# Patient Record
Sex: Male | Born: 1965
Health system: Southern US, Community
[De-identification: ages and names within clinical notes are randomized; demographics above are authoritative.]

## PROBLEM LIST (undated history)

## (undated) DIAGNOSIS — F419 Anxiety disorder, unspecified: Secondary | ICD-10-CM

## (undated) HISTORY — PX: CSF SHUNT: SHX92

## (undated) HISTORY — DX: Anxiety disorder, unspecified: F41.9

---

## 2011-05-24 ENCOUNTER — Ambulatory Visit: Payer: Self-pay | Admitting: Internal Medicine

## 2011-10-11 ENCOUNTER — Emergency Department: Payer: Self-pay | Admitting: Internal Medicine

## 2012-11-28 ENCOUNTER — Emergency Department: Payer: Self-pay | Admitting: Internal Medicine

## 2013-09-15 ENCOUNTER — Emergency Department: Payer: Self-pay | Admitting: Emergency Medicine

## 2013-09-15 LAB — BASIC METABOLIC PANEL
Anion Gap: 3 — ABNORMAL LOW (ref 7–16)
BUN: 10 mg/dL (ref 7–18)
Calcium, Total: 9.1 mg/dL (ref 8.5–10.1)
Creatinine: 1.02 mg/dL (ref 0.60–1.30)
EGFR (African American): >60
EGFR (Non-African Amer.): >60
Osmolality: 274 (ref 275–301)
Sodium: 137 mmol/L (ref 136–145)

## 2013-09-15 LAB — CBC
HCT: 40.9 % (ref 40.0–52.0)
MCHC: 35.2 g/dL (ref 32.0–36.0)
MCV: 85 fL (ref 80–100)
RDW: 13 % (ref 11.5–14.5)
WBC: 10.6 10*3/uL (ref 3.8–10.6)

## 2014-09-19 IMAGING — CR DG CHEST 2V
1 series · 3 of 3 positions shown · non-contrast
Comparison: None.

CLINICAL DATA: Chest pain today with tingling in left arm and jaw
pain

EXAM:
CHEST  2 VIEW

[Series 1: pa · 0.17mm/px · 3 of 3 slices shown]
[im 1/3]
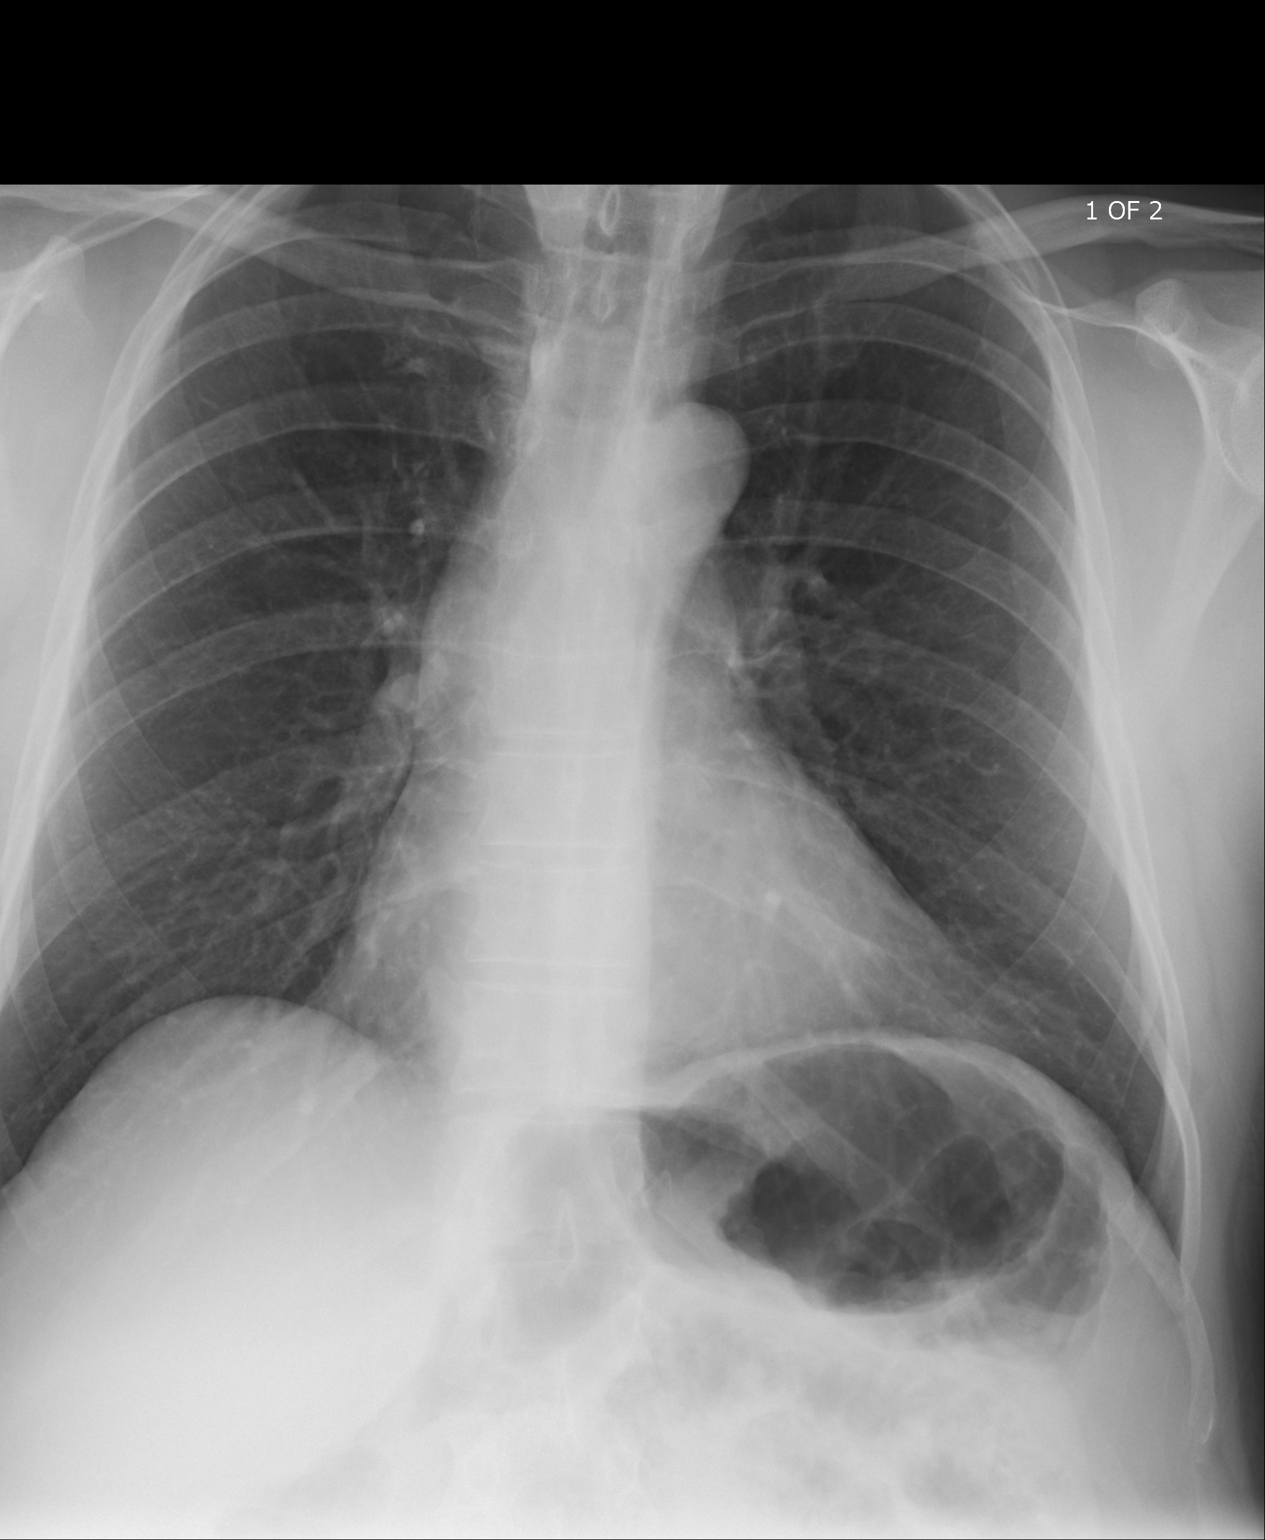
[im 2/3]
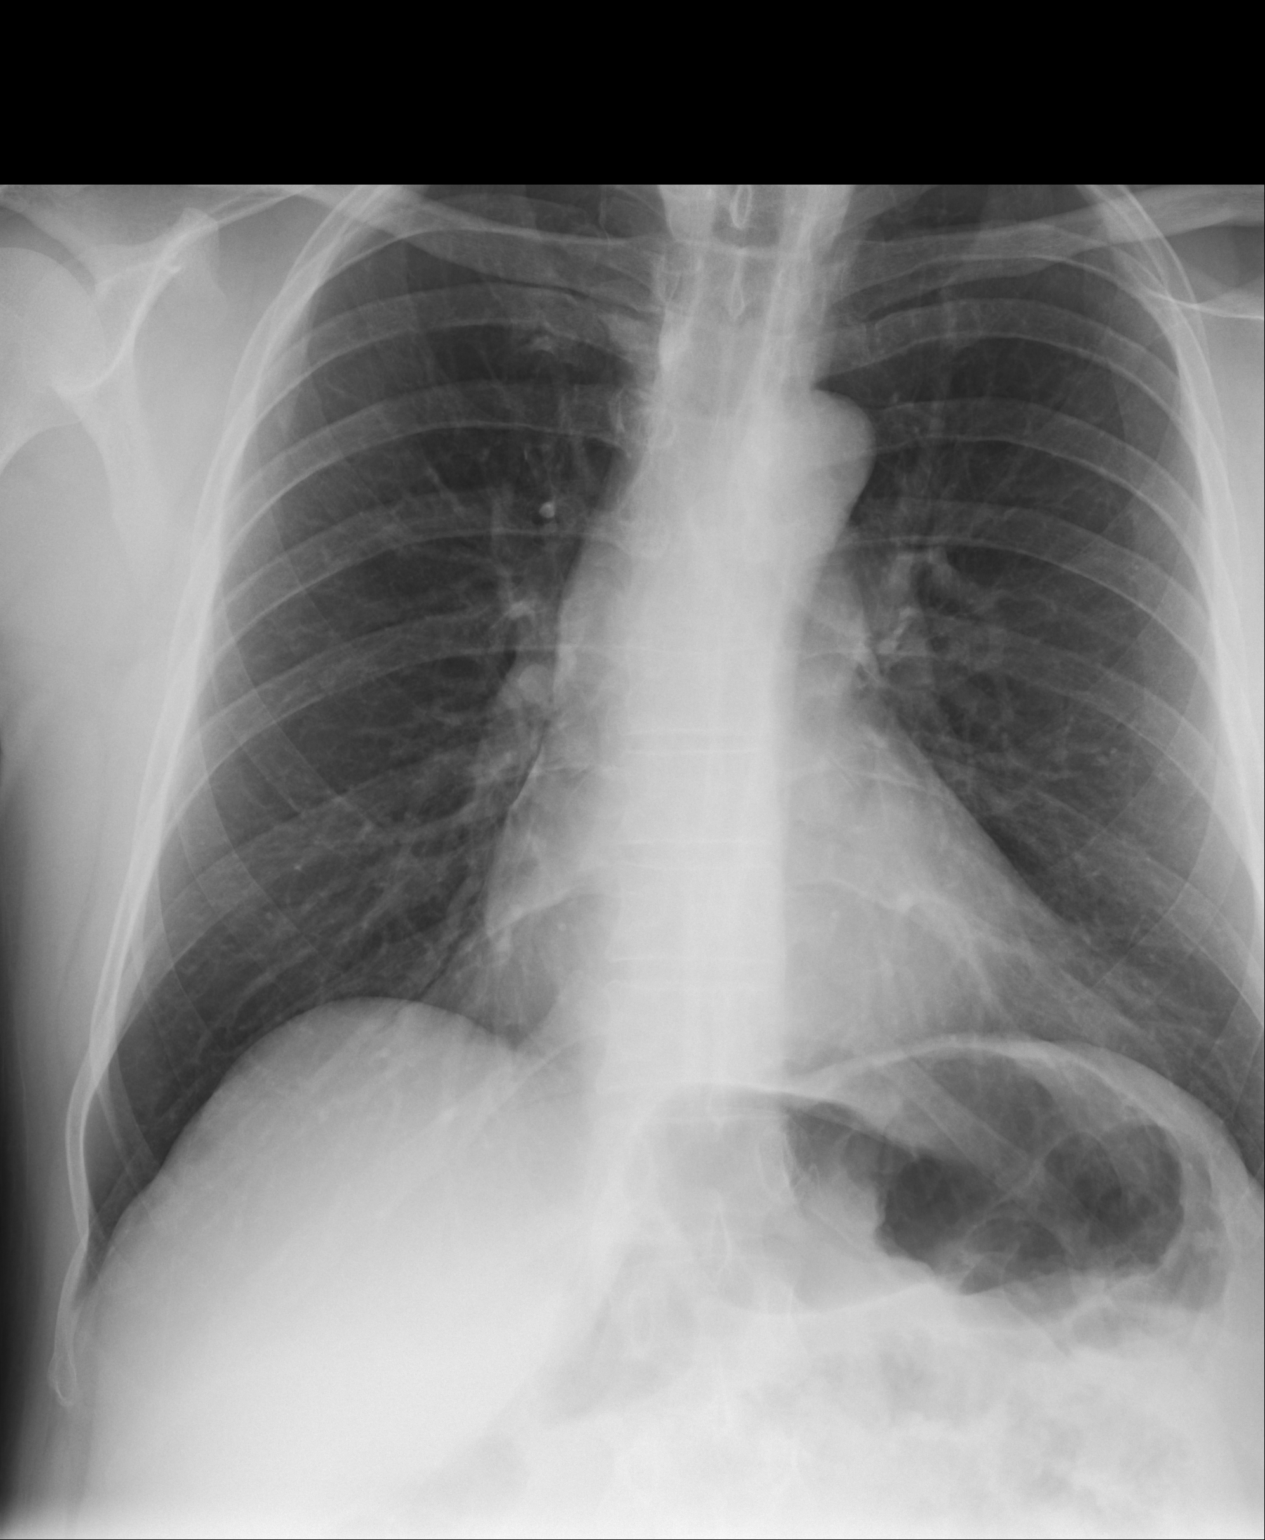
[im 3/3]
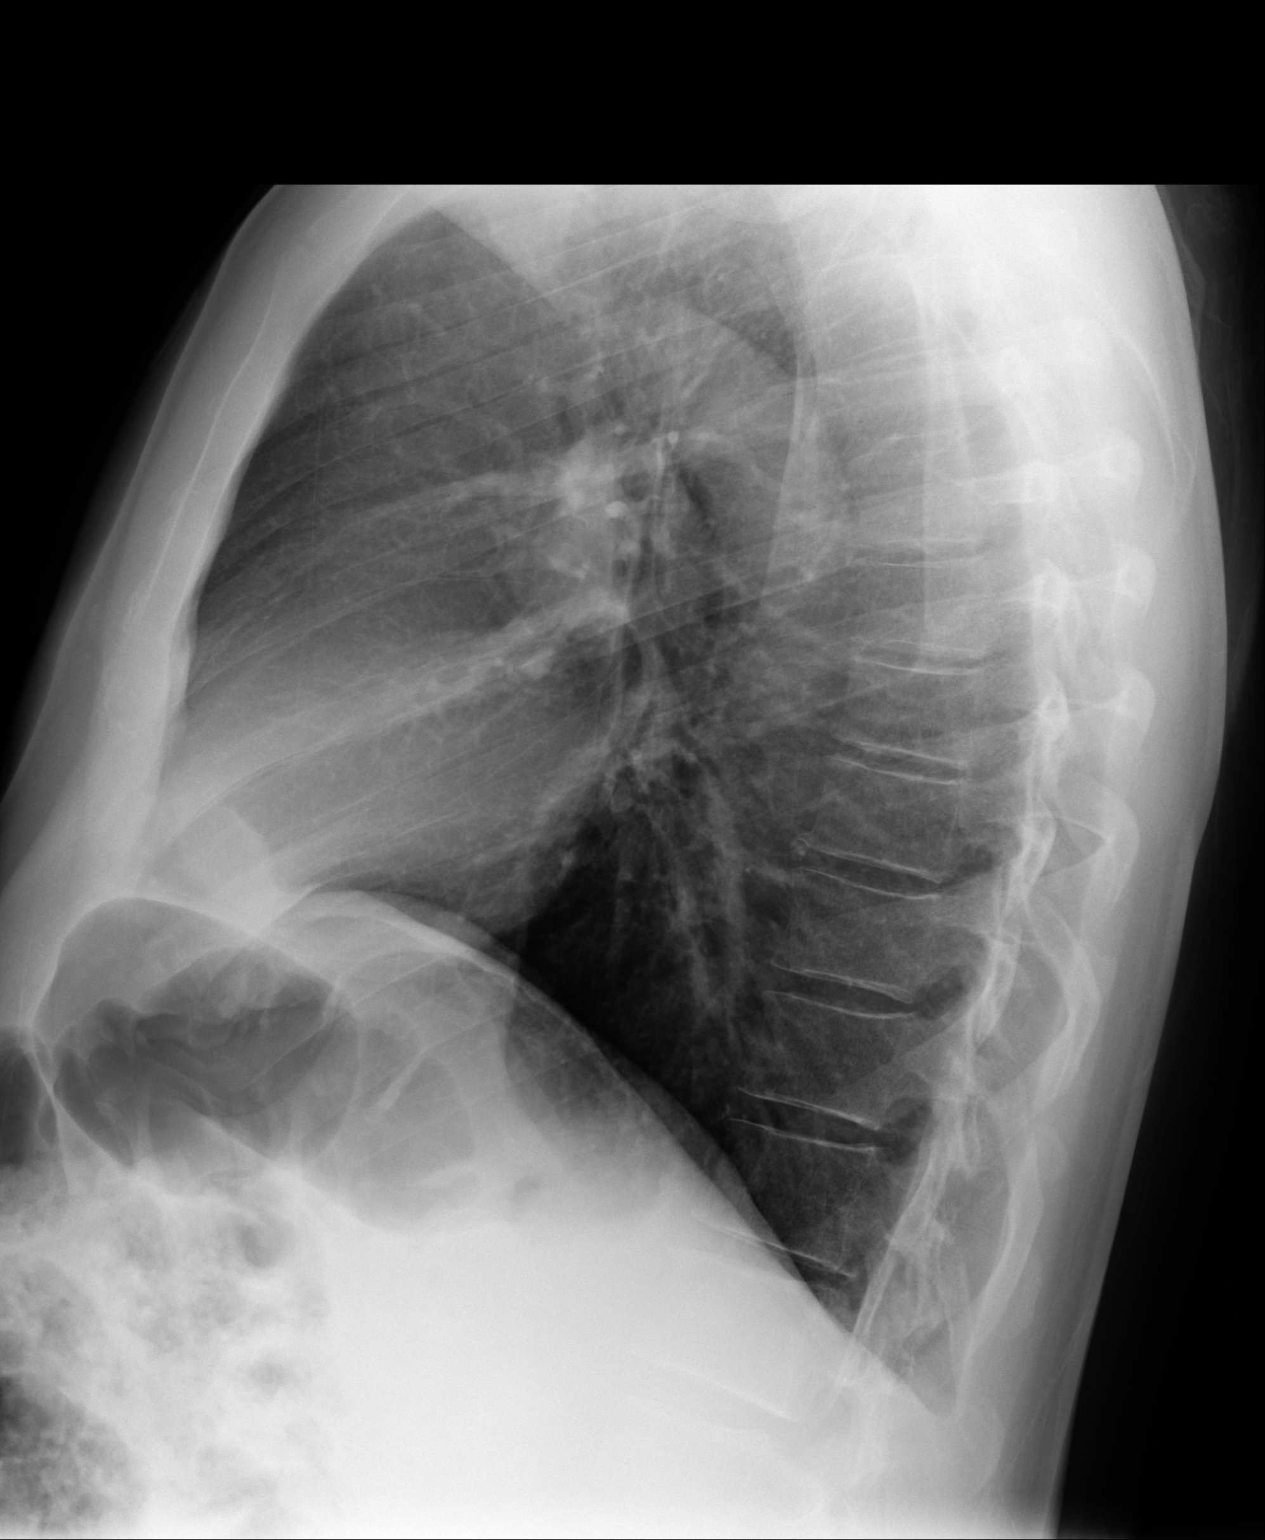

[3 of 3 positions shown; findings below may reference images not displayed]

FINDINGS: The heart size and mediastinal contours are within normal limits.
Both lungs are clear. The visualized skeletal structures are
unremarkable.
IMPRESSION: No active cardiopulmonary disease.

## 2020-07-03 ENCOUNTER — Ambulatory Visit (INDEPENDENT_AMBULATORY_CARE_PROVIDER_SITE_OTHER): Payer: BC Managed Care – PPO | Admitting: Family Medicine

## 2020-07-03 ENCOUNTER — Encounter: Payer: Self-pay | Admitting: Family Medicine

## 2020-07-03 ENCOUNTER — Other Ambulatory Visit: Payer: Self-pay

## 2020-07-03 VITALS — BP 142/90 | HR 80 | Temp 98.6°F | Ht 74.5 in | Wt 216.0 lb

## 2020-07-03 DIAGNOSIS — Z1211 Encounter for screening for malignant neoplasm of colon: Secondary | ICD-10-CM

## 2020-07-03 DIAGNOSIS — Z131 Encounter for screening for diabetes mellitus: Secondary | ICD-10-CM | POA: Diagnosis not present

## 2020-07-03 DIAGNOSIS — Z114 Encounter for screening for human immunodeficiency virus [HIV]: Secondary | ICD-10-CM

## 2020-07-03 DIAGNOSIS — Z13220 Encounter for screening for lipoid disorders: Secondary | ICD-10-CM

## 2020-07-03 DIAGNOSIS — I10 Essential (primary) hypertension: Secondary | ICD-10-CM

## 2020-07-03 DIAGNOSIS — G4700 Insomnia, unspecified: Secondary | ICD-10-CM | POA: Diagnosis not present

## 2020-07-03 DIAGNOSIS — F411 Generalized anxiety disorder: Secondary | ICD-10-CM

## 2020-07-03 DIAGNOSIS — E663 Overweight: Secondary | ICD-10-CM

## 2020-07-03 DIAGNOSIS — F320 Major depressive disorder, single episode, mild: Secondary | ICD-10-CM

## 2020-07-03 DIAGNOSIS — Z1159 Encounter for screening for other viral diseases: Secondary | ICD-10-CM | POA: Diagnosis not present

## 2020-07-03 DIAGNOSIS — F329 Major depressive disorder, single episode, unspecified: Secondary | ICD-10-CM | POA: Insufficient documentation

## 2020-07-03 MED ORDER — TRAZODONE HCL 50 MG PO TABS: 25.0000 mg | ORAL_TABLET | Freq: Every evening | ORAL | 3 refills | Status: DC | PRN

## 2020-07-03 NOTE — Assessment & Plan Note (Signed)
Suspect 2/2 to anxiety. He would like to try trazodone. Also treatment for anxiety.

## 2020-07-03 NOTE — Progress Notes (Signed)
Subjective:     Brian Melendez is a 54 y.o. male presenting for Establish Care     HPI  #Depression/anxiety - some mild symptoms  - mom is having some health issues, stress work - taking benadryl at night to help with sleep - will stay up thinking about all the stress and work related thing  #Elevated blood pressures - has checked at [facility] with high bp - and at the ER  Review of Systems   Social History   Tobacco Use  Smoking Status Never Smoker  Smokeless Tobacco Never Used        Objective:    BP Readings from Last 3 Encounters:  07/03/20 (!) 142/90   Wt Readings from Last 3 Encounters:  07/03/20 216 lb (98 kg)    BP (!) 142/90   Pulse 80   Temp 98.6 F (37 C) (Temporal)   Ht 6' 2.5" (1.892 m)   Wt 216 lb (98 kg)   SpO2 97%   BMI 27.36 kg/m    Physical Exam Constitutional:      Appearance: Normal appearance. He is not ill-appearing or diaphoretic.  HENT:     Right Ear: External ear normal.     Left Ear: External ear normal.     Nose: Nose normal.  Eyes:     General: No scleral icterus.    Extraocular Movements: Extraocular movements intact.     Conjunctiva/sclera: Conjunctivae normal.  Cardiovascular:     Rate and Rhythm: Normal rate and regular rhythm.     Heart sounds: No murmur heard.   Pulmonary:     Effort: Pulmonary effort is normal. No respiratory distress.     Breath sounds: Normal breath sounds. No wheezing.  Musculoskeletal:     Cervical back: Neck supple.  Skin:    General: Skin is warm and dry.  Neurological:     Mental Status: He is alert. Mental status is at baseline.  Psychiatric:        Mood and Affect: Mood normal.      Depression screen [facility] 2/9 07/03/2020  Decreased Interest 2  Down, Depressed, Hopeless 1  PHQ - 2 Score 3  Altered sleeping 2  Tired, decreased energy 2  Change in appetite 1  Feeling bad or failure about yourself  1  Trouble concentrating 0  Moving slowly or fidgety/restless 0  Suicidal  thoughts 0  PHQ-9 Score 9  Difficult doing work/chores Somewhat difficult   GAD 7 : Generalized Anxiety Score 07/03/2020  Nervous, Anxious, on Edge 3  Control/stop worrying 1  Worry too much - different things 2  Trouble relaxing 3  Restless 0  Easily annoyed or irritable 2  Afraid - awful might happen 1  Total GAD 7 Score 12  Anxiety Difficulty Somewhat difficult           Assessment & Plan:   Problem List Items Addressed This Visit      Cardiovascular and Mediastinum   Essential hypertension    BP elevated today and previously. Labs. Offered medication today but he will do lifestyle changes and home monitoring. Return 3 months      Relevant Orders   Comprehensive metabolic panel   CBC   TSH     Other   Overweight (BMI 25.0-29.9)   Insomnia - Primary    Suspect 2/2 to anxiety. He would like to try trazodone. Also treatment for anxiety.       Relevant Medications   traZODone (DESYREL) 50 MG  tablet   Generalized anxiety disorder    Hx consistent with anxiety and gad and phq-9 elevated. Pt not interested in medication at this time. Advised therapy and hand out given for mindfulness practice. Return if worsening or wanting to try medication      Relevant Medications   traZODone (DESYREL) 50 MG tablet   Major depressive disorder with single episode   Relevant Medications   traZODone (DESYREL) 50 MG tablet    Other Visit Diagnoses    Screening for hyperlipidemia       Relevant Orders   Lipid panel   Screening for diabetes mellitus       Relevant Orders   Hemoglobin A1c   Need for hepatitis C screening test       Relevant Orders   Hepatitis C antibody   Encounter for screening for HIV       Relevant Orders   HIV Antibody (routine testing w rflx)   Encounter for screening colonoscopy       Relevant Orders   Fecal occult blood, imunochemical       Return in about 3 months (around 10/03/2020).  Lynnda Child, MD  This visit occurred during the  SARS-CoV-2 public health emergency.  Safety protocols were in place, including screening questions prior to the visit, additional usage of staff PPE, and extensive cleaning of exam room while observing appropriate contact time as indicated for disinfecting solutions.

## 2020-07-03 NOTE — Patient Instructions (Addendum)
Your blood pressure high.   High blood pressure increases your risk for heart attack and stroke.    Please check your blood pressure 2-4 times a week.   To check your blood pressure 1) Sit in a quiet and relaxed place for 5 minutes 2) Make sure your feet are flat on the ground 3) Consider checking first thing in the morning   Normal blood pressure is less than 140/90 Ideally you blood pressure should be around 120/80  Other ways you can reduce your blood pressure:  1) Regular exercise -- Try to get 150 minutes (30 minutes, 5 days a week) of moderate to vigorous aerobic excercise -- Examples: brisk walking (2.5 miles per hour), water aerobics, dancing, gardening, tennis, biking slower than 10 miles per hour 2) DASH Diet - low fat meats, more fresh fruits and vegetables, whole grains, low salt 3) Quit smoking if you smoke 4) Loose 5-10% of your body weight     How to help anxiety - without medication.   1) Regular Exercise - walking, jogging, cycling, dancing, strength training --> Yoga has been shown in research to reduce depression and anxiety -- with even just one hour long session per week  2)  Begin a Mindfulness/Meditation practice -- this can take a little as 3 minutes and is helpful for all kinds of mood issues -- You can find resources in books -- Or you can download apps like  ---- Headspace App (which currently has free content called "Weathering the Storm") ---- Calm (which has a few free options)  ---- Insignt Timer ---- Stop, Breathe & Think  # With each of these Apps - you should decline the "start free trial" offer and as you search through the App should be able to access some of their free content. You can also chose to pay for the content if you find one that works well for you.   # Many of them also offer sleep specific content which may help with insomnia  3) Healthy Diet -- Avoid or decrease Caffeine -- Avoid or decrease Alcohol -- Drink plenty of  water, have a balanced diet -- Avoid cigarettes and marijuana (as well as other recreational drugs)  4) Consider contacting a professional therapist  -- [facility] is one option. Call [phone] -- Or you can check out www.psychologytoday.com -- you can read bios of therapists and see if they accept insurance -- Check with your insurance to see if you have coverage and who may take your insurance     Sleep hygiene checklist: 1. Avoid naps during the day 2. Avoid stimulants such as caffeine and nicotine. Avoid bedtime alcohol (it can speed onset of sleep but the body's metabolism can cause awakenings). At least 2 hours before bedtime 3. All forms of exercise help ensure sound sleep - limit vigorous exercise to morning or late afternoon 4. Avoid food too close to bedtime including chocolate (which contains caffeine) 5. Soak up natural light 6. Establish regular bedtime routine. 7. Associate bed with sleep - avoid TV, computer or phone, reading while in bed. 8. Ensure pleasant, relaxing sleep environment - quiet, dark, cool room.  Good Sleep Hygiene Habits -- Got to bed and wake up within an hour of the same time every day -- Avoid bright screens (from laptop, phone, TV) within at least 30 minutes before bed. The "blue light" supresses the sleep hormone melatonin and the content may stimulate as well -- Maintain a quiet and dark sleep environment (  blackout curtains, turn on a fan or white noise to block out disruptive sounds) -- Practicing relaxing activites before bed (taking a shower, reading a book, journaling, meditation app) -- To quiet a busy mind -- consider journaling before bed (jotting down reminders, worry thoughts, as well as positive things like a gratitude list)

## 2020-07-03 NOTE — Assessment & Plan Note (Signed)
BP elevated today and previously. Labs. Offered medication today but he will do lifestyle changes and home monitoring. Return 3 months

## 2020-07-03 NOTE — Assessment & Plan Note (Signed)
Hx consistent with anxiety and gad and phq-9 elevated. Pt not interested in medication at this time. Advised therapy and hand out given for mindfulness practice. Return if worsening or wanting to try medication

## 2020-07-04 LAB — CBC
HCT: 42 % (ref 39.0–52.0)
Hemoglobin: 13.9 g/dL (ref 13.0–17.0)
MCHC: 33.2 g/dL (ref 30.0–36.0)
MCV: 85.7 fl (ref 78.0–100.0)
Platelets: 256 10*3/uL (ref 150.0–400.0)
RBC: 4.9 Mil/uL (ref 4.22–5.81)
RDW: 13.4 % (ref 11.5–15.5)
WBC: 11.9 10*3/uL — ABNORMAL HIGH (ref 4.0–10.5)

## 2020-07-04 LAB — LIPID PANEL
Cholesterol: 218 mg/dL — ABNORMAL HIGH (ref 0–200)
HDL: 40.3 mg/dL (ref >39.00)
LDL Cholesterol: 145 mg/dL — ABNORMAL HIGH (ref 0–99)
NonHDL: 177.47
Total CHOL/HDL Ratio: 5
Triglycerides: 163 mg/dL — ABNORMAL HIGH (ref 0.0–149.0)
VLDL: 32.6 mg/dL (ref 0.0–40.0)

## 2020-07-04 LAB — COMPREHENSIVE METABOLIC PANEL
ALT: 22 U/L (ref 0–53)
AST: 21 U/L (ref 0–37)
Albumin: 4.5 g/dL (ref 3.5–5.2)
Alkaline Phosphatase: 75 U/L (ref 39–117)
BUN: 14 mg/dL (ref 6–23)
CO2: 31 mEq/L (ref 19–32)
Calcium: 9.9 mg/dL (ref 8.4–10.5)
Chloride: 101 mEq/L (ref 96–112)
Creatinine, Ser: 1 mg/dL (ref 0.40–1.50)
GFR: 78.32 mL/min (ref >60.00)
Glucose, Bld: 88 mg/dL (ref 70–99)
Potassium: 4.4 mEq/L (ref 3.5–5.1)
Sodium: 138 mEq/L (ref 135–145)
Total Bilirubin: 1 mg/dL (ref 0.2–1.2)
Total Protein: 7.3 g/dL (ref 6.0–8.3)

## 2020-07-04 LAB — HEMOGLOBIN A1C: Hgb A1c MFr Bld: 6.1 % (ref 4.6–6.5)

## 2020-07-04 LAB — HEPATITIS C ANTIBODY
Hepatitis C Ab: NONREACTIVE
SIGNAL TO CUT-OFF: 0.22 (ref <1.00)

## 2020-07-04 LAB — TSH: TSH: 2.07 u[IU]/mL (ref 0.35–4.50)

## 2020-07-04 LAB — HIV ANTIBODY (ROUTINE TESTING W REFLEX): HIV 1&2 Ab, 4th Generation: NONREACTIVE

## 2020-08-18 ENCOUNTER — Encounter: Payer: Self-pay | Admitting: Family Medicine

## 2020-10-03 ENCOUNTER — Other Ambulatory Visit: Payer: Self-pay

## 2020-10-03 ENCOUNTER — Ambulatory Visit (INDEPENDENT_AMBULATORY_CARE_PROVIDER_SITE_OTHER): Payer: BC Managed Care – PPO | Admitting: Family Medicine

## 2020-10-03 VITALS — BP 126/82 | HR 88 | Temp 98.3°F | Ht 75.0 in | Wt 215.5 lb

## 2020-10-03 DIAGNOSIS — G4700 Insomnia, unspecified: Secondary | ICD-10-CM | POA: Diagnosis not present

## 2020-10-03 DIAGNOSIS — F320 Major depressive disorder, single episode, mild: Secondary | ICD-10-CM

## 2020-10-03 DIAGNOSIS — Z1211 Encounter for screening for malignant neoplasm of colon: Secondary | ICD-10-CM | POA: Diagnosis not present

## 2020-10-03 DIAGNOSIS — F411 Generalized anxiety disorder: Secondary | ICD-10-CM | POA: Diagnosis not present

## 2020-10-03 MED ORDER — TRAZODONE HCL 50 MG PO TABS: 75.0000 mg | ORAL_TABLET | Freq: Every evening | ORAL | 3 refills | Status: DC | PRN

## 2020-10-03 MED ORDER — ESCITALOPRAM OXALATE 10 MG PO TABS: 10.0000 mg | ORAL_TABLET | Freq: Every day | ORAL | 1 refills | Status: DC

## 2020-10-03 NOTE — Patient Instructions (Addendum)
You are going to start a new antidepressant medication.   One of the risks of this medication is increase in suicidal thoughts.   Your suicide Action plan is as follows:  1) Call sister, girlfriend, son 2) Call the Suicide Hotline [phone] which is available 24 hours 3) Call the Clinic   The most common side effect is stomach upset. If this happens it means the medication is working. It should get better in 1-3 weeks.   Medication for depression and anxiety often takes 6-8 weeks to have a noticeable difference so stick with it. Also the best way for recovery is taking medication and seeing a therapist -- this is so important.    Send MyChart message as needed to update

## 2020-10-03 NOTE — Assessment & Plan Note (Signed)
Some initial improvement with trazodone but wearing off. Increase dose to 75-100 mg.

## 2020-10-03 NOTE — Progress Notes (Signed)
Subjective:     Brian Melendez is a 54 y.o. 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 Insomnia - trazodone. Anxiety - therapy and handout.  08/18/2020: Mychart - anxiety worse - ashwagandha (mother end of life)  Social History   Tobacco Use  Smoking Status Never Smoker  Smokeless Tobacco Never Used        Objective:    BP Readings from Last 3 Encounters:  10/03/20 126/82  07/03/20 (!) 142/90   Wt Readings from Last 3 Encounters:  10/03/20 215 lb 8 oz (97.8 kg)  07/03/20 216 lb (98 kg)    BP 126/82   Pulse 88   Temp 98.3 F (36.8 C) (Temporal)   Ht 6\' 3"  (1.905 m)   Wt 215 lb 8 oz (97.8 kg)   SpO2 97%   BMI 26.94 kg/m    Physical Exam Constitutional:      Appearance: Normal appearance. He is not ill-appearing or diaphoretic.  HENT:     Right Ear: External ear normal.     Left Ear: External ear normal.  Eyes:     General: No scleral icterus.    Extraocular Movements: Extraocular movements intact.     Conjunctiva/sclera: Conjunctivae normal.  Cardiovascular:     Rate and Rhythm: Normal rate.  Pulmonary:     Effort: Pulmonary effort is normal.  Musculoskeletal:     Cervical back: Neck supple.  Skin:    General: Skin is warm and dry.  Neurological:     Mental Status: He is alert. Mental status is at baseline.  Psychiatric:        Mood and Affect: Mood is depressed.      Depression screen [facility] 2/9 10/03/2020 07/03/2020  Decreased Interest 3 2  Down,  Depressed, Hopeless 3 1  PHQ - 2 Score 6 3  Altered sleeping 3 2  Tired, decreased energy 3 2  Change in appetite 2 1  Feeling bad or failure about yourself  3 1  Trouble concentrating 2 0  Moving slowly or fidgety/restless 1 0  Suicidal thoughts 0 0  PHQ-9 Score 20 9  Difficult doing work/chores Somewhat difficult Somewhat difficult   GAD 7 : Generalized Anxiety Score 10/03/2020 07/03/2020  Nervous, Anxious, on Edge 3 3  Control/stop worrying 3 1  Worry too much - different things 3 2  Trouble relaxing 3 3  Restless 1 0  Easily annoyed or irritable 3 2  Afraid - awful might happen 1 1  Total GAD 7 Score 17 12  Anxiety Difficulty Somewhat difficult Somewhat difficult           Assessment & Plan:   Problem List Items Addressed This Visit      Other   Insomnia    Some initial improvement with trazodone but wearing off. Increase dose to 75-100 mg.       Relevant Medications   traZODone (DESYREL) 50 MG tablet  Generalized anxiety disorder - Primary    Symptoms worse. Start Lexapro 10 mg. Return in 6 weeks      Relevant Medications   escitalopram (LEXAPRO) 10 MG tablet   traZODone (DESYREL) 50 MG tablet   Major depressive disorder with single episode    Symptoms worse in setting of his mother's death 1 month ago. Will start lexapro 10 mg daily. He will look into therapy options as well.       Relevant Medications   escitalopram (LEXAPRO) 10 MG tablet   traZODone (DESYREL) 50 MG tablet    Other Visit Diagnoses    Screening for colon cancer       Relevant Orders   Fecal occult blood, imunochemical       Return in about 6 weeks (around 11/14/2020).  Lynnda Child, MD  This visit occurred during the SARS-CoV-2 public health emergency.  Safety protocols were in place, including screening questions prior to the visit, additional usage of staff PPE, and extensive cleaning of exam room while observing appropriate contact time as indicated for disinfecting  solutions.

## 2020-10-03 NOTE — Assessment & Plan Note (Signed)
Symptoms worse in setting of his mother's death 1 month ago. Will start lexapro 10 mg daily. He will look into therapy options as well.

## 2020-10-03 NOTE — Assessment & Plan Note (Signed)
Symptoms worse. Start Lexapro 10 mg. Return in 6 weeks

## 2020-11-03 ENCOUNTER — Other Ambulatory Visit: Payer: Self-pay | Admitting: Family Medicine

## 2020-11-03 DIAGNOSIS — F411 Generalized anxiety disorder: Secondary | ICD-10-CM

## 2020-11-03 DIAGNOSIS — F320 Major depressive disorder, single episode, mild: Secondary | ICD-10-CM

## 2020-11-13 ENCOUNTER — Encounter: Payer: Self-pay | Admitting: Family Medicine

## 2020-11-13 DIAGNOSIS — F411 Generalized anxiety disorder: Secondary | ICD-10-CM

## 2020-11-13 DIAGNOSIS — F320 Major depressive disorder, single episode, mild: Secondary | ICD-10-CM

## 2020-11-13 MED ORDER — ESCITALOPRAM OXALATE 10 MG PO TABS: 10.0000 mg | ORAL_TABLET | Freq: Every day | ORAL | 1 refills | Status: DC

## 2020-11-14 NOTE — Telephone Encounter (Signed)
Called patient to rsc.Moved to 1/13

## 2020-11-16 ENCOUNTER — Ambulatory Visit: Payer: BC Managed Care – PPO | Admitting: Family Medicine

## 2020-11-23 ENCOUNTER — Other Ambulatory Visit: Payer: Self-pay

## 2020-11-23 ENCOUNTER — Ambulatory Visit (INDEPENDENT_AMBULATORY_CARE_PROVIDER_SITE_OTHER): Payer: BC Managed Care – PPO | Admitting: Family Medicine

## 2020-11-23 ENCOUNTER — Encounter: Payer: Self-pay | Admitting: Family Medicine

## 2020-11-23 DIAGNOSIS — F320 Major depressive disorder, single episode, mild: Secondary | ICD-10-CM | POA: Diagnosis not present

## 2020-11-23 DIAGNOSIS — E782 Mixed hyperlipidemia: Secondary | ICD-10-CM | POA: Diagnosis not present

## 2020-11-23 DIAGNOSIS — G4700 Insomnia, unspecified: Secondary | ICD-10-CM | POA: Diagnosis not present

## 2020-11-23 DIAGNOSIS — F411 Generalized anxiety disorder: Secondary | ICD-10-CM

## 2020-11-23 DIAGNOSIS — E785 Hyperlipidemia, unspecified: Secondary | ICD-10-CM | POA: Insufficient documentation

## 2020-11-23 MED ORDER — ESCITALOPRAM OXALATE 20 MG PO TABS: 20.0000 mg | ORAL_TABLET | Freq: Every day | ORAL | 3 refills | Status: DC

## 2020-11-23 MED ORDER — TRAZODONE HCL 50 MG PO TABS: 75.0000 mg | ORAL_TABLET | Freq: Every day | ORAL | 3 refills | Status: DC

## 2020-11-23 NOTE — Progress Notes (Signed)
Subjective:     Brian Melendez is a 55 y.o. male presenting for Follow-up (6 week- anxiety)     HPI   #Insomnia - doing well on 75 mg of trazodone  #anxiety - taking lexapro - still has some low days - would like to try to increase - overall getting better - switched to nighttime due to side effects   Review of Systems   Social History   Tobacco Use  Smoking Status Never Smoker  Smokeless Tobacco Never Used        Objective:    BP Readings from Last 3 Encounters:  11/23/20 120/80  10/03/20 126/82  07/03/20 (!) 142/90   Wt Readings from Last 3 Encounters:  11/23/20 217 lb (98.4 kg)  10/03/20 215 lb 8 oz (97.8 kg)  07/03/20 216 lb (98 kg)    BP 120/80   Pulse 87   Temp 98.6 F (37 C) (Temporal)   Ht 6\' 3"  (1.905 m)   Wt 217 lb (98.4 kg)   SpO2 95%   BMI 27.12 kg/m    Physical Exam Constitutional:      Appearance: Normal appearance. He is not ill-appearing or diaphoretic.  HENT:     Right Ear: External ear normal.     Left Ear: External ear normal.     Nose: Nose normal.  Eyes:     General: No scleral icterus.    Extraocular Movements: Extraocular movements intact.     Conjunctiva/sclera: Conjunctivae normal.  Cardiovascular:     Rate and Rhythm: Normal rate.  Pulmonary:     Effort: Pulmonary effort is normal.  Musculoskeletal:     Cervical back: Neck supple.  Skin:    General: Skin is warm and dry.  Neurological:     Mental Status: He is alert. Mental status is at baseline.  Psychiatric:        Mood and Affect: Mood normal.    GAD 7 : Generalized Anxiety Score 11/23/2020 10/03/2020 07/03/2020  Nervous, Anxious, on Edge 1 3 3   Control/stop worrying 1 3 1   Worry too much - different things 1 3 2   Trouble relaxing 1 3 3   Restless 0 1 0  Easily annoyed or irritable 1 3 2   Afraid - awful might happen 1 1 1   Total GAD 7 Score 6 17 12   Anxiety Difficulty Somewhat difficult Somewhat difficult Somewhat difficult     The 10-year ASCVD  risk score 07/05/2020 DC Jr., et al., 2013) is: 5.2%   Values used to calculate the score:     Age: 19 years     Sex: Male     Is Non-Hispanic African American: No     Diabetic: No     Tobacco smoker: No     Systolic Blood Pressure: 120 mmHg     Is BP treated: No     HDL Cholesterol: 40.3 mg/dL     Total Cholesterol: 218 mg/dL      Assessment & Plan:   Problem List Items Addressed This Visit      Other   Insomnia    Improved on trazodone 75 mg. Cont.       Relevant Medications   traZODone (DESYREL) 50 MG tablet   Generalized anxiety disorder    Significant improvement, but still with some difficult days. Increase lexpro 10>20 mg. Mychart check back in 6 weeks      Relevant Medications   escitalopram (LEXAPRO) 20 MG tablet   traZODone (DESYREL) 50 MG tablet  Major depressive disorder with single episode    Improved. Increase lexapro. Discussed remaining on medication for 1 year to decrease relapse      Relevant Medications   escitalopram (LEXAPRO) 20 MG tablet   traZODone (DESYREL) 50 MG tablet   Hyperlipidemia    Continue lifestyle changes.           Return in about 1 year (around 11/23/2021).  Lynnda Child, MD  This visit occurred during the SARS-CoV-2 public health emergency.  Safety protocols were in place, including screening questions prior to the visit, additional usage of staff PPE, and extensive cleaning of exam room while observing appropriate contact time as indicated for disinfecting solutions.

## 2020-11-23 NOTE — Patient Instructions (Signed)
Send MyChart in 4-6 weeks to update  Send sooner if worsening symptoms  Refills sent to pharmacy  Lexapro 20 mg sent to pharmacy

## 2020-11-23 NOTE — Assessment & Plan Note (Addendum)
Improved. Increase lexapro. Discussed remaining on medication for 1 year to decrease relapse

## 2020-11-23 NOTE — Assessment & Plan Note (Signed)
Improved on trazodone 75 mg. Cont.

## 2020-11-23 NOTE — Assessment & Plan Note (Signed)
Significant improvement, but still with some difficult days. Increase lexpro 10>20 mg. Mychart check back in 6 weeks

## 2020-11-23 NOTE — Assessment & Plan Note (Signed)
Continue lifestyle changes

## 2021-11-22 ENCOUNTER — Other Ambulatory Visit: Payer: Self-pay | Admitting: Family Medicine

## 2021-11-22 DIAGNOSIS — G4700 Insomnia, unspecified: Secondary | ICD-10-CM

## 2021-11-23 NOTE — Telephone Encounter (Signed)
Called patient.  No answer.

## 2021-11-26 ENCOUNTER — Encounter: Payer: Self-pay | Admitting: Family Medicine

## 2021-11-26 NOTE — Telephone Encounter (Signed)
2nd attempt  LMTCB to schedule also Mychart sent

## 2022-01-06 ENCOUNTER — Other Ambulatory Visit: Payer: Self-pay | Admitting: Family Medicine

## 2022-01-06 DIAGNOSIS — G4700 Insomnia, unspecified: Secondary | ICD-10-CM

## 2024-11-23 ENCOUNTER — Ambulatory Visit: Payer: Self-pay | Admitting: Family Medicine

## 2024-11-23 ENCOUNTER — Encounter: Payer: Self-pay | Admitting: Family Medicine

## 2024-11-23 ENCOUNTER — Ambulatory Visit: Admitting: Family Medicine

## 2024-11-23 VITALS — BP 125/85 | HR 77 | Temp 98.2°F | Ht 73.0 in | Wt 215.5 lb

## 2024-11-23 DIAGNOSIS — F411 Generalized anxiety disorder: Secondary | ICD-10-CM

## 2024-11-23 DIAGNOSIS — Z125 Encounter for screening for malignant neoplasm of prostate: Secondary | ICD-10-CM | POA: Diagnosis not present

## 2024-11-23 DIAGNOSIS — Z8042 Family history of malignant neoplasm of prostate: Secondary | ICD-10-CM | POA: Diagnosis not present

## 2024-11-23 DIAGNOSIS — Z8249 Family history of ischemic heart disease and other diseases of the circulatory system: Secondary | ICD-10-CM

## 2024-11-23 DIAGNOSIS — F320 Major depressive disorder, single episode, mild: Secondary | ICD-10-CM | POA: Diagnosis not present

## 2024-11-23 DIAGNOSIS — E782 Mixed hyperlipidemia: Secondary | ICD-10-CM | POA: Diagnosis not present

## 2024-11-23 DIAGNOSIS — Z1211 Encounter for screening for malignant neoplasm of colon: Secondary | ICD-10-CM | POA: Diagnosis not present

## 2024-11-23 DIAGNOSIS — E663 Overweight: Secondary | ICD-10-CM

## 2024-11-23 DIAGNOSIS — G4700 Insomnia, unspecified: Secondary | ICD-10-CM

## 2024-11-23 DIAGNOSIS — I10 Essential (primary) hypertension: Secondary | ICD-10-CM | POA: Diagnosis not present

## 2024-11-23 DIAGNOSIS — Z23 Encounter for immunization: Secondary | ICD-10-CM

## 2024-11-23 LAB — LIPID PANEL
Cholesterol: 212 mg/dL — ABNORMAL HIGH (ref 28–200)
HDL: 43.6 mg/dL
LDL Cholesterol: 136 mg/dL — ABNORMAL HIGH (ref 10–99)
NonHDL: 168.16
Total CHOL/HDL Ratio: 5
Triglycerides: 163 mg/dL — ABNORMAL HIGH (ref 10.0–149.0)
VLDL: 32.6 mg/dL (ref 0.0–40.0)

## 2024-11-23 LAB — COMPREHENSIVE METABOLIC PANEL WITH GFR
ALT: 26 U/L (ref 3–53)
AST: 25 U/L (ref 5–37)
Albumin: 4.5 g/dL (ref 3.5–5.2)
Alkaline Phosphatase: 68 U/L (ref 39–117)
BUN: 15 mg/dL (ref 6–23)
CO2: 28 meq/L (ref 19–32)
Calcium: 9.4 mg/dL (ref 8.4–10.5)
Chloride: 103 meq/L (ref 96–112)
Creatinine, Ser: 0.91 mg/dL (ref 0.40–1.50)
GFR: 93.98 mL/min
Glucose, Bld: 117 mg/dL — ABNORMAL HIGH (ref 70–99)
Potassium: 4.5 meq/L (ref 3.5–5.1)
Sodium: 139 meq/L (ref 135–145)
Total Bilirubin: 1 mg/dL (ref 0.2–1.2)
Total Protein: 7.3 g/dL (ref 6.0–8.3)

## 2024-11-23 LAB — CBC WITH DIFFERENTIAL/PLATELET
Basophils Absolute: 0.1 K/uL (ref 0.0–0.1)
Basophils Relative: 0.7 % (ref 0.0–3.0)
Eosinophils Absolute: 0.2 K/uL (ref 0.0–0.7)
Eosinophils Relative: 2 % (ref 0.0–5.0)
HCT: 41.5 % (ref 39.0–52.0)
Hemoglobin: 14.2 g/dL (ref 13.0–17.0)
Lymphocytes Relative: 31.3 % (ref 12.0–46.0)
Lymphs Abs: 2.6 K/uL (ref 0.7–4.0)
MCHC: 34.2 g/dL (ref 30.0–36.0)
MCV: 84.3 fl (ref 78.0–100.0)
Monocytes Absolute: 0.5 K/uL (ref 0.1–1.0)
Monocytes Relative: 5.6 % (ref 3.0–12.0)
Neutro Abs: 4.9 K/uL (ref 1.4–7.7)
Neutrophils Relative %: 60.4 % (ref 43.0–77.0)
Platelets: 262 K/uL (ref 150.0–400.0)
RBC: 4.93 Mil/uL (ref 4.22–5.81)
RDW: 13.1 % (ref 11.5–15.5)
WBC: 8.2 K/uL (ref 4.0–10.5)

## 2024-11-23 LAB — TSH: TSH: 2.11 u[IU]/mL (ref 0.35–5.50)

## 2024-11-23 LAB — PSA: PSA: 0.8 ng/mL (ref 0.10–4.00)

## 2024-11-23 MED ORDER — TRAZODONE HCL 50 MG PO TABS: 50.0000 mg | ORAL_TABLET | Freq: Every evening | ORAL | 0 refills | Status: AC | PRN

## 2024-11-23 NOTE — Assessment & Plan Note (Signed)
 Disc goals for lipids and reasons to control them Rev last labs with pt Rev low sat fat diet in detail  Lab today  Father had young CAD/MI  May discuss cardiac ca test at his upcoming annual exam

## 2024-11-23 NOTE — Assessment & Plan Note (Signed)
 Father -early   Pt has not voiding changes or symptoms   Lab for psa today

## 2024-11-23 NOTE — Assessment & Plan Note (Signed)
 Father had prostate cancer  No voiding symptoms Psa today

## 2024-11-23 NOTE — Assessment & Plan Note (Signed)
 bp in fair control at this -but room for improvement  Better on 2nd check   BP Readings from Last 1 Encounters:  11/23/24 125/85   No medications  Discussed lifestyle changes  Most recent labs reviewed  Disc lifstyle change with low sodium diet and exercise   Lab today Follow up for annual exam

## 2024-11-23 NOTE — Assessment & Plan Note (Signed)
 Lab for lipid today Will discuss at annual exam

## 2024-11-23 NOTE — Patient Instructions (Addendum)
 Instead of medication for anxiety/ mood I want you to investigate your option for counseling at work / E[employer]  Take care of yourself Add some strength training to your routine, this is important for bone and brain health and can reduce your risk of falls and help your body use insulin properly and regulate weight  Light weights, exercise bands , and internet videos are a good way to start  Yoga (chair or regular), machines , floor exercises or a gym with machines are also good options    Keep working on soda A gradual shift to no caffeine and later no sugar may be helpful   Try the trazodone  50-100 mg at bedtime for sleep    Lab today for wellness  Flu shot today   Follow up for health maintenance visit

## 2024-11-23 NOTE — Assessment & Plan Note (Signed)
 Worsened by stress Discussed sleep hygiene today  Goal to continue to cut soda/caffeine   Will try trazodone  again at higher dose 50-100 and report back

## 2024-11-23 NOTE — Assessment & Plan Note (Signed)
 This seems reactive Entirely due to stress Reviewed stressors/ coping techniques/symptoms/ support sources/ tx options and side effects in detail today  Suspect that counseling would be much more helpful than medication  In past lexapro  caused fogginess /weird feeling   Pt would like to reach out to work for EAP counseling  Discussed importance of med and self care Will continue to cut sugar/caffeine/sodas  Will try trazodone  for sleep again

## 2024-11-23 NOTE — Assessment & Plan Note (Signed)
 Reactive/job related  Currently anxiety is more of the problem

## 2024-11-23 NOTE — Progress Notes (Signed)
 "  Subjective:    Patient ID: Brian Melendez, male    DOB: 08-27-1966, 59 y.o.   MRN: [id number]  HPI  Wt Readings from Last 3 Encounters:  11/23/24 215 lb 8 oz (97.8 kg)  11/23/20 217 lb (98.4 kg)  10/03/20 215 lb 8 oz (97.8 kg)   28.43 kg/m  Vitals:   11/23/24 0818 11/23/24 0841  BP: (!) 148/90 125/85  Pulse: 77   Temp: 98.2 F (36.8 C)   SpO2: 98%     Pt presents to transfer care from Dr Velma (who left the practice)  Follow up  HTN Hyperlipidemia GAD/ dep/insomnia  Colon cancer screening   Flu shot today  Wants to put tetanus off until next time    HTN bp is stable today  No cp or palpitations or headaches or edema  No side effects to medicines  BP Readings from Last 3 Encounters:  11/23/24 125/85  11/23/20 120/80  10/03/20 126/82    Treated with lifestyle change  Lab Results  Component Value Date   NA 138 07/03/2020   K 4.4 07/03/2020   CO2 31 07/03/2020   GLUCOSE 88 07/03/2020   BUN 14 07/03/2020   CREATININE 1.00 07/03/2020   CALCIUM 9.9 07/03/2020   GFR 78.32 07/03/2020   GFRNONAA >60 09/15/2013   Works 6-7 days per week  Walks over 17,000 steps per day Too tired to exercise when he comes home   Hard on him due to not sleeping well     Hyperlipidemia Lab Results  Component Value Date   CHOL 218 (H) 07/03/2020   HDL 40.30 07/03/2020   LDLCALC 145 (H) 07/03/2020   TRIG 163.0 (H) 07/03/2020   CHOLHDL 5 07/03/2020   Treated with diet/lifestyle change    GAD Insomnia  History of major depressive episode      10/03/2020    8:23 AM 07/03/2020    3:59 PM  Depression screen PHQ 2/9  Decreased Interest 3 2  Down, Depressed, Hopeless 3 1  PHQ - 2 Score 6 3  Altered sleeping 3 2  Tired, decreased energy 3 2  Change in appetite 2 1  Feeling bad or failure about yourself  3 1  Trouble concentrating 2 0  Moving slowly or fidgety/restless 1 0  Suicidal thoughts 0 0  PHQ-9 Score 20  9   Difficult doing work/chores Somewhat difficult  Somewhat difficult     Data saved with a previous flowsheet row definition      11/23/2020    4:53 PM 10/03/2020    8:24 AM 07/03/2020    4:00 PM  GAD 7 : Generalized Anxiety Score  Nervous, Anxious, on Edge 1 3 3   Control/stop worrying 1 3 1   Worry too much - different things 1 3 2   Trouble relaxing 1 3 3   Restless 0 1 0  Easily annoyed or irritable 1 3 2   Afraid - awful might happen 1 1 1   Total GAD 7 Score 6 17 12   Anxiety Difficulty Somewhat difficult Somewhat difficult Somewhat difficult   Very stressful job-worse since pandemic  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)   Long term wants to move to [place] with girl friend   Mom passed away in 12/31/19 -house transferred to his name  Was taking care of her  Son lives with him at 49 -afraid to leave him   By summer or fall would like to move there     Used to take lexapro - made  him feel out of it  Trazodone  25-50 mg for sleep- helped sleep for 3-4 hours   Is addicted to soft drinks  Cutting down - weaned 5 to 2 cans per day (caffeine and sugar)    Fam history No colon cancer  Father had prostate cancer      Patient Active Problem List   Diagnosis Date Noted   Family history of prostate cancer 11/23/2024   Family history of early CAD 11/23/2024   Prostate cancer screening 11/23/2024   Colon cancer screening 11/23/2024   Hyperlipidemia 11/23/2020   Overweight (BMI 25.0-29.9) 07/03/2020   Essential hypertension 07/03/2020   Insomnia 07/03/2020   Generalized anxiety disorder 07/03/2020   Major depressive disorder with single episode 07/03/2020   Past Medical History:  Diagnosis Date   Anxiety    Past Surgical History:  Procedure Laterality Date   CSF SHUNT     hydrocephalus as a child    Social History[1] Family History  Problem Relation Age of Onset   Depression Mother    Diabetes Mother    Early death Father    Heart attack Father 14   Heart disease Father    High blood pressure Father     Prostate cancer Father 10   Cancer Father    Varicose Veins Father    High Cholesterol Sister    Mental illness Maternal Aunt    Alzheimer's disease Paternal Grandmother    Allergies[2] Medications Ordered Prior to Encounter[3]  Review of Systems  Constitutional:  Positive for fatigue. Negative for activity change, appetite change, fever and unexpected weight change.  HENT:  Negative for congestion, rhinorrhea, sore throat and trouble swallowing.   Eyes:  Negative for pain, redness, itching and visual disturbance.  Respiratory:  Negative for cough, chest tightness, shortness of breath and wheezing.   Cardiovascular:  Negative for chest pain and palpitations.  Gastrointestinal:  Negative for abdominal pain, blood in stool, constipation, diarrhea and nausea.  Endocrine: Negative for cold intolerance, heat intolerance, polydipsia and polyuria.  Genitourinary:  Negative for difficulty urinating, dysuria, frequency and urgency.  Musculoskeletal:  Negative for arthralgias, joint swelling and myalgias.  Skin:  Negative for pallor and rash.  Neurological:  Negative for dizziness, tremors, weakness, numbness and headaches.       Had shunt at birth for hydrocephalus  Was never removed He is not bothered by it   Hematological:  Negative for adenopathy. Does not bruise/bleed easily.  Psychiatric/Behavioral:  Positive for sleep disturbance. Negative for agitation, decreased concentration, dysphoric mood and suicidal ideas. The patient is nervous/anxious.        Objective:   Physical Exam Constitutional:      General: He is not in acute distress.    Appearance: Normal appearance. He is well-developed. He is not ill-appearing or diaphoretic.     Comments: overwt  HENT:     Head: Normocephalic and atraumatic.  Eyes:     General:        Right eye: No discharge.        Left eye: No discharge.     Conjunctiva/sclera: Conjunctivae normal.     Pupils: Pupils are equal, round, and reactive to  light.  Neck:     Thyroid : No thyromegaly.     Vascular: No carotid bruit or JVD.  Cardiovascular:     Rate and Rhythm: Normal rate and regular rhythm.     Heart sounds: Normal heart sounds.     No gallop.  Pulmonary:  Effort: Pulmonary effort is normal. No respiratory distress.     Breath sounds: Normal breath sounds. No wheezing or rales.  Abdominal:     General: There is no distension or abdominal bruit.     Palpations: Abdomen is soft.  Musculoskeletal:     Cervical back: Normal range of motion and neck supple.     Right lower leg: No edema.     Left lower leg: No edema.  Lymphadenopathy:     Cervical: No cervical adenopathy.  Skin:    General: Skin is warm and dry.     Coloration: Skin is not pale.     Findings: No rash.  Neurological:     Mental Status: He is alert.     Coordination: Coordination normal.     Deep Tendon Reflexes: Reflexes are normal and symmetric. Reflexes normal.  Psychiatric:        Attention and Perception: Attention normal.        Mood and Affect: Mood is anxious.        Cognition and Memory: Cognition and memory normal.     Comments: Mildly anxious Candidly discusses symptoms and stressors             Assessment & Plan:   Problem List Items Addressed This Visit       Cardiovascular and Mediastinum   Essential hypertension   bp in fair control at this -but room for improvement  Better on 2nd check   BP Readings from Last 1 Encounters:  11/23/24 125/85   No medications  Discussed lifestyle changes  Most recent labs reviewed  Disc lifstyle change with low sodium diet and exercise   Lab today Follow up for annual exam       Relevant Orders   TSH   Lipid panel   Comprehensive metabolic panel with GFR   CBC with Differential/Platelet     Other   Prostate cancer screening   Father had prostate cancer  No voiding symptoms Psa today      Relevant Orders   PSA   Overweight (BMI 25.0-29.9)   Discussed how this problem  influences overall health and the risks it imposes  Reviewed plan for weight loss with lower calorie diet (via better food choices (lower glycemic and portion control) along with exercise building up to or more than 30 minutes 5 days per week including some aerobic activity and strength training    Continues to cut soda (down to 2)        Major depressive disorder with single episode   Reactive/job related  Currently anxiety is more of the problem      Relevant Medications   traZODone  (DESYREL ) 50 MG tablet   Insomnia   Worsened by stress Discussed sleep hygiene today  Goal to continue to cut soda/caffeine   Will try trazodone  again at higher dose 50-100 and report back        Relevant Medications   traZODone  (DESYREL ) 50 MG tablet   Hyperlipidemia   Disc goals for lipids and reasons to control them Rev last labs with pt Rev low sat fat diet in detail  Lab today  Father had young CAD/MI  May discuss cardiac ca test at his upcoming annual exam      Relevant Orders   Lipid panel   Comprehensive metabolic panel with GFR   Generalized anxiety disorder - Primary   This seems reactive Entirely due to stress Reviewed stressors/ coping techniques/symptoms/ support sources/ tx options and side  effects in detail today  Suspect that counseling would be much more helpful than medication  In past lexapro  caused fogginess /weird feeling   Pt would like to reach out to work for EAP counseling  Discussed importance of med and self care Will continue to cut sugar/caffeine/sodas  Will try trazodone  for sleep again       Relevant Medications   traZODone  (DESYREL ) 50 MG tablet   Family history of prostate cancer   Father -early   Pt has not voiding changes or symptoms   Lab for psa today      Family history of early CAD   Lab for lipid today Will discuss at annual exam      Colon cancer screening   Will discuss at upcoming annual exam         [1]  Social  History Tobacco Use   Smoking status: Never   Smokeless tobacco: Never  Vaping Use   Vaping status: Never Used  Substance Use Topics   Alcohol use: Never   Drug use: Never  [2] No Known Allergies [3]  No current outpatient medications on file prior to visit.   No current facility-administered medications on file prior to visit.   "

## 2024-11-23 NOTE — Assessment & Plan Note (Signed)
 Will discuss at upcoming annual exam

## 2024-11-23 NOTE — Assessment & Plan Note (Signed)
 Discussed how this problem influences overall health and the risks it imposes  Reviewed plan for weight loss with lower calorie diet (via better food choices (lower glycemic and portion control) along with exercise building up to or more than 30 minutes 5 days per week including some aerobic activity and strength training    Continues to cut soda (down to 2)

## 2024-12-21 ENCOUNTER — Encounter: Admitting: Family Medicine
